# Patient Record
Sex: Female | Born: 1990 | Race: White | Hispanic: No | Marital: Single | Smoking: Never smoker
Health system: Southern US, Community
[De-identification: ages and names within clinical notes are randomized; demographics above are authoritative.]

---

## 2011-01-13 ENCOUNTER — Encounter: Payer: Self-pay | Admitting: Emergency Medicine

## 2011-01-13 ENCOUNTER — Emergency Department
Admission: EM | Admit: 2011-01-13 | Discharge: 2011-01-13 | Disposition: A | Discharge status: Home / Self Care | Payer: BC Managed Care – PPO | Source: Home / Self Care | Attending: Family Medicine | Admitting: Family Medicine

## 2011-01-13 DIAGNOSIS — R5381 Other malaise: Secondary | ICD-10-CM

## 2011-01-13 DIAGNOSIS — J039 Acute tonsillitis, unspecified: Secondary | ICD-10-CM

## 2011-01-13 DIAGNOSIS — R5383 Other fatigue: Secondary | ICD-10-CM

## 2011-01-13 DIAGNOSIS — B2790 Infectious mononucleosis, unspecified without complication: Secondary | ICD-10-CM

## 2011-01-13 LAB — POCT RAPID STREP A (OFFICE): Rapid Strep A Screen: NEGATIVE

## 2011-01-13 MED ORDER — LIDOCAINE VISCOUS 2 % MT SOLN: 15.0000 mL | Freq: Three times a day (TID) | OROMUCOSAL | Status: AC

## 2011-01-13 MED ORDER — PREDNISONE 10 MG PO TABS: ORAL_TABLET | ORAL | Status: AC

## 2011-01-13 MED ORDER — HYDROCODONE-ACETAMINOPHEN 5-500 MG PO TABS: 1.0000 | ORAL_TABLET | Freq: Every evening | ORAL | Status: AC | PRN

## 2011-01-13 NOTE — ED Provider Notes (Signed)
History     CSN: [id number] Arrival date & time: 01/13/2011  5:59 PM   First MD Initiated Contact with Patient 01/13/11 1803      Chief Complaint  Patient presents with  . Sore Throat      HPI Comments: Patient states that she developed sinus congestion about a week ago that improved somewhat, then 5 days ago developed sore throat that has gradually become worse.  She states that this is the worst sore throat she has ever had, and awakens her at night.  She is able to swallow, however  Patient is a 20 y.o. female presenting with pharyngitis. The history is provided by the patient and a parent.  Sore Throat This is a new problem. The current episode started more than 2 days ago (One week ago). The problem occurs constantly. The problem has been gradually worsening. Associated symptoms comments: Sinus congestion. The symptoms are aggravated by swallowing. The symptoms are relieved by nothing. Treatments tried: Ibuprofen. The treatment provided no relief.    History reviewed. No pertinent past medical history.  History reviewed. No pertinent past surgical history.  History reviewed. No pertinent family history.  History  Substance Use Topics  . Smoking status: Not on file  . Smokeless tobacco: Not on file  . Alcohol Use: Not on file    OB History    Grav Para Term Preterm Abortions TAB SAB Ect Mult Living                  Review of Systems  Constitutional: Positive for chills, appetite change and fatigue.  HENT: Positive for congestion, sore throat, trouble swallowing, neck pain and neck stiffness.   Eyes: Negative.   Respiratory: Negative.   Cardiovascular: Negative.   Gastrointestinal: Negative.   Genitourinary: Negative.   Skin: Negative.   Neurological: Negative.   Hematological: Positive for adenopathy.    Allergies  Review of patient's allergies indicates no known allergies.  Home Medications   Current Outpatient Rx  Name Route Sig Dispense Refill  .  IBUPROFEN 600 MG PO TABS Oral Take 600 mg by mouth every 6 (six) hours as needed.      Marland Kitchen HYDROCODONE-ACETAMINOPHEN 5-500 MG PO TABS Oral Take 1-2 tablets by mouth at bedtime as needed for pain. 10 tablet 0  . LIDOCAINE VISCOUS 2 % MT SOLN Oral Take 15 mLs by mouth 4 (four) times daily -  before meals and at bedtime. Gargle, then expectorate 200 mL 0  . PREDNISONE 10 MG PO TABS  Take 2 tabs today, then 2 tabs twice daily for three days, then one tab twice daily for 2 days, then 1 daily for two days.  Take PC 20 tablet 0    BP 126/85  Pulse 94  Temp(Src) 97.7 F (36.5 C) (Oral)  Resp 16  Ht 5\' 5"  (1.651 m)  Wt 254 lb (115.214 kg)  BMI 42.27 kg/m2  SpO2 99%  LMP 01/02/2011  Physical Exam  Constitutional: She is oriented to person, place, and time. She appears well-developed and well-nourished. No distress.       Obese  HENT:  Head: Normocephalic.  Right Ear: External ear normal.  Left Ear: External ear normal.  Nose: Nose normal.  Mouth/Throat: Uvula is midline and mucous membranes are normal. Oropharyngeal exudate, posterior oropharyngeal edema and posterior oropharyngeal erythema present.       Small amount of exudate present on tonsils, left worse than right  Eyes: Conjunctivae and EOM are normal. Pupils are equal,  round, and reactive to light. Right eye exhibits no discharge. Left eye exhibits no discharge.  Neck: Neck supple. No thyromegaly present.  Cardiovascular: Normal heart sounds.   Pulmonary/Chest: Effort normal and breath sounds normal. No stridor.  Abdominal: Soft. Bowel sounds are normal.       Mild tenderness over spleen without masses or hepatosplenomegaly.  Bowel sounds are present.  No CVA or flank tenderness.   Musculoskeletal: She exhibits no edema.  Lymphadenopathy:    She has cervical adenopathy.       Right cervical: Superficial cervical and posterior cervical adenopathy present.       Left cervical: Superficial cervical and posterior cervical adenopathy  present.  Neurological: She is alert and oriented to person, place, and time.  Skin: Skin is warm and dry.  Psychiatric: She has a normal mood and affect.    ED Course  Procedures:  None   Labs Reviewed  STREP A DNA PROBE:  Pending  MONONUCLEOSIS SCREEN:  Positive  POCT RAPID STREP A (OFFICE):  Negative      1. Acute tonsillitis   2. Fatigue   3. Mononucleosis       MDM  Throat culture pending Begin prednisone taper.  Analgesic for bedtime. Lidocaine viscous gargles. Rest, increase fluids.  Mono precautions given. Followup with PCP if not improving one week.        Donna Christen, MD 01/13/11 [phone]

## 2011-01-17 NOTE — ED Notes (Signed)
Rapid Mono spot- positive. Lovey Newcomer Result not in "lab results".

## 2011-03-02 ENCOUNTER — Encounter (HOSPITAL_COMMUNITY): Payer: Self-pay | Admitting: Emergency Medicine

## 2011-03-02 ENCOUNTER — Emergency Department (HOSPITAL_COMMUNITY)
Admission: EM | Admit: 2011-03-02 | Discharge: 2011-03-02 | Disposition: A | Discharge status: Home / Self Care | Payer: BC Managed Care – PPO | Attending: Emergency Medicine | Admitting: Emergency Medicine

## 2011-03-02 ENCOUNTER — Emergency Department (HOSPITAL_COMMUNITY): Payer: BC Managed Care – PPO

## 2011-03-02 DIAGNOSIS — R109 Unspecified abdominal pain: Secondary | ICD-10-CM

## 2011-03-02 DIAGNOSIS — R1084 Generalized abdominal pain: Secondary | ICD-10-CM | POA: Insufficient documentation

## 2011-03-02 DIAGNOSIS — R112 Nausea with vomiting, unspecified: Secondary | ICD-10-CM

## 2011-03-02 LAB — COMPREHENSIVE METABOLIC PANEL
Alkaline Phosphatase: 102 U/L (ref 39–117)
BUN: 12 mg/dL (ref 6–23)
Chloride: 99 mEq/L (ref 96–112)
Creatinine, Ser: 0.6 mg/dL (ref 0.50–1.10)
GFR calc Af Amer: >90 mL/min (ref >90)
Glucose, Bld: 121 mg/dL — ABNORMAL HIGH (ref 70–99)
Potassium: 3.5 mEq/L (ref 3.5–5.1)
Total Bilirubin: 0.3 mg/dL (ref 0.3–1.2)

## 2011-03-02 LAB — DIFFERENTIAL
Eosinophils Absolute: 0.1 10*3/uL (ref 0.0–0.7)
Lymphs Abs: 1.6 10*3/uL (ref 0.7–4.0)
Monocytes Absolute: 1.2 10*3/uL — ABNORMAL HIGH (ref 0.1–1.0)
Monocytes Relative: 11 % (ref 3–12)
Neutro Abs: 8.1 10*3/uL — ABNORMAL HIGH (ref 1.7–7.7)
Neutrophils Relative %: 73 % (ref 43–77)

## 2011-03-02 LAB — URINALYSIS, ROUTINE W REFLEX MICROSCOPIC
Bilirubin Urine: NEGATIVE
Glucose, UA: NEGATIVE mg/dL
Ketones, ur: 40 mg/dL — AB
Nitrite: NEGATIVE
Specific Gravity, Urine: 1.017 (ref 1.005–1.030)
pH: 8 (ref 5.0–8.0)

## 2011-03-02 LAB — CBC
HCT: 44.5 % (ref 36.0–46.0)
Hemoglobin: 15.6 g/dL — ABNORMAL HIGH (ref 12.0–15.0)
MCH: 29.1 pg (ref 26.0–34.0)
RBC: 5.36 MIL/uL — ABNORMAL HIGH (ref 3.87–5.11)

## 2011-03-02 LAB — LIPASE, BLOOD: Lipase: 17 U/L (ref 11–59)

## 2011-03-02 MED ORDER — SODIUM CHLORIDE 0.9 % IV BOLUS (SEPSIS)
1000.0000 mL | Freq: Once | INTRAVENOUS | Status: AC
  Administered 2011-03-02: 1000 mL via INTRAVENOUS

## 2011-03-02 MED ORDER — HYDROMORPHONE HCL PF 1 MG/ML IJ SOLN
1.0000 mg | Freq: Once | INTRAMUSCULAR | Status: AC
  Administered 2011-03-02: 1 mg via INTRAVENOUS
  Filled 2011-03-02: qty 1

## 2011-03-02 MED ORDER — ONDANSETRON HCL 4 MG/2ML IJ SOLN
4.0000 mg | Freq: Once | INTRAMUSCULAR | Status: AC
  Administered 2011-03-02: 4 mg via INTRAVENOUS
  Filled 2011-03-02: qty 2

## 2011-03-02 MED ORDER — OXYCODONE-ACETAMINOPHEN 5-325 MG PO TABS: 2.0000 | ORAL_TABLET | ORAL | Status: AC | PRN

## 2011-03-02 MED ORDER — HYDROMORPHONE HCL PF 2 MG/ML IJ SOLN
2.0000 mg | Freq: Once | INTRAMUSCULAR | Status: AC
  Administered 2011-03-02: 2 mg via INTRAMUSCULAR
  Filled 2011-03-02: qty 1

## 2011-03-02 MED ORDER — NITROFURANTOIN MONOHYD MACRO 100 MG PO CAPS: 100.0000 mg | ORAL_CAPSULE | Freq: Two times a day (BID) | ORAL | Status: AC

## 2011-03-02 MED ORDER — IOHEXOL 300 MG/ML  SOLN
100.0000 mL | Freq: Once | INTRAMUSCULAR | Status: AC | PRN
  Administered 2011-03-02: 100 mL via INTRAVENOUS

## 2011-03-02 NOTE — ED Notes (Signed)
Pt is in radiology.

## 2011-03-02 NOTE — ED Provider Notes (Signed)
Medical screening examination/treatment/procedure(s) were performed by non-physician practitioner and as supervising physician I was immediately available for consultation/collaboration.   Srijan Givan, MD 03/02/11 0754 

## 2011-03-02 NOTE — ED Notes (Signed)
Notified ct, patient done drinking ct.

## 2011-03-02 NOTE — ED Notes (Signed)
Pt alert, c/o right abd pain, onset this evening, treated for "mono" in November, resp even unlabored, skin pwd, ambulates to triage

## 2011-03-02 NOTE — ED Provider Notes (Signed)
7:33 AM Patient care resumed from [facility].  Patient came in complaining of right lower Cordran right upper quadrant abdominal pain.  A CT of the abdomen and pelvis was ordered to rule out appendicitis.  There were no abnormal findings including adnexal or suprapubic tenderness on pelvic exam per Theron Arista.  Plan is to treat patient for possible urinary tract infection pain management while in the emergency department and schedule followup with her PCP.  The possibility of doing an abdominal ultrasound was discussed with Theron Arista.    CT :IMPRESSION: Normal appendix. No evidence of bowel obstruction.  No CT findings to account for the patient's abdominal pain.  Original Report Authenticated By: Charline Bills, M.D.  [place]: IMPRESSION:  Mobile gallstones without other sonographic evidence for acute  cholecystitis. Otherwise normal exam.  Patient has been reevaluated and pain has been managed while in the emergency department.  She currently is not having any emesis episodes, abdominal pain, or nausea & is hemodynamically stable.  Patient will be discharged with pain medication and an antibiotic for possible urinary tract infection.  Patient will also be given contact information for general surgery if her symptoms persist.    Jaci Carrel, [place] 03/02/11 [phone]

## 2011-03-02 NOTE — ED Provider Notes (Signed)
History     CSN: [id number]  Arrival date & time 03/02/11  0317   First MD Initiated Contact with Patient 03/02/11 0329      Chief Complaint  Patient presents with  . Abdominal Pain    Right  . Emesis     HPI  History provided by the patient and mother. Patient is 20 year old female with no significant past medical history presents with acute onset of right-sided and lower abdominal pains beginning around midnight. Pain is a sharp stabbing pain that has been persistent. Patient reports lying down in bed at the time when pain began. Patient's mother also reports that patient has history of recent mono infection and diagnosis in November. Patient reports many of the symptoms related to the illness have resolved. Patient was told she had a enlarged spleen at that time. sHe denies any left upper quadrant pains. Symptoms have been associated with one episode of nausea and vomiting. Patient is currently menstruating but denies any abnormalities. She denies any fever, chills, sweats, diarrhea, constipation. Patient has no other significant past medical history.    History reviewed. No pertinent past medical history.  History reviewed. No pertinent past surgical history.  No family history on file.  History  Substance Use Topics  . Smoking status: Not on file  . Smokeless tobacco: Not on file  . Alcohol Use: Not on file    OB History    Grav Para Term Preterm Abortions TAB SAB Ect Mult Living                  Review of Systems  Constitutional: Negative for fever and chills.  Respiratory: Negative for cough and shortness of breath.   Cardiovascular: Negative for chest pain.  Gastrointestinal: Positive for nausea, vomiting and abdominal pain. Negative for diarrhea and constipation.  Genitourinary: Negative for dysuria, frequency, hematuria, flank pain, vaginal discharge and pelvic pain.  All other systems reviewed and are negative.    Allergies  Review of patient's  allergies indicates no known allergies.  Home Medications   Current Outpatient Rx  Name Route Sig Dispense Refill  . IBUPROFEN 600 MG PO TABS Oral Take 600 mg by mouth every 6 (six) hours as needed.        BP 160/87  Pulse 78  Temp(Src) 97 F (36.1 C) (Oral)  Resp 16  Wt 200 lb (90.719 kg)  SpO2 99%  LMP 03/02/2011  Physical Exam  Nursing note and vitals reviewed. Constitutional: She is oriented to person, place, and time. She appears well-developed and well-nourished. No distress.  HENT:  Head: Normocephalic and atraumatic.  Cardiovascular: Normal rate and regular rhythm.   Pulmonary/Chest: Effort normal and breath sounds normal. She has no wheezes. She has no rales.  Abdominal: Soft. There is tenderness in the right upper quadrant and right lower quadrant. There is no rigidity, no rebound, no guarding, no CVA tenderness, no tenderness at McBurney's point and negative Murphy's sign.       Obese  Genitourinary: Cervix exhibits no motion tenderness. Right adnexum displays no mass, no tenderness and no fullness. Left adnexum displays no mass, no tenderness and no fullness. No vaginal discharge found.       Chaperone was present. Menstrual bleeding present no cervical motion tenderness, no adnexal tenderness or mass.  Neurological: She is alert and oriented to person, place, and time.  Skin: Skin is warm and dry. No rash noted.  Psychiatric: She has a normal mood and affect. Her behavior is normal.  ED Course  Procedures (including critical care time)   Labs Reviewed  URINALYSIS, ROUTINE W REFLEX MICROSCOPIC  POCT PREGNANCY, URINE  CBC  DIFFERENTIAL  COMPREHENSIVE METABOLIC PANEL  LIPASE, BLOOD  GC/CHLAMYDIA PROBE AMP, GENITAL  WET PREP, GENITAL   Results for orders placed during the hospital encounter of 03/02/11  CBC      Component Value Range   WBC 11.1 (*) 4.0 - 10.5 (K/uL)   RBC 5.36 (*) 3.87 - 5.11 (MIL/uL)   Hemoglobin 15.6 (*) 12.0 - 15.0 (g/dL)   HCT 16.1   09.6 - 04.5 (%)   MCV 83.0  78.0 - 100.0 (fL)   MCH 29.1  26.0 - 34.0 (pg)   MCHC 35.1  30.0 - 36.0 (g/dL)   RDW 40.9  81.1 - 91.4 (%)   Platelets 313  150 - 400 (K/uL)  DIFFERENTIAL      Component Value Range   Neutrophils Relative 73  43 - 77 (%)   Neutro Abs 8.1 (*) 1.7 - 7.7 (K/uL)   Lymphocytes Relative 15  12 - 46 (%)   Lymphs Abs 1.6  0.7 - 4.0 (K/uL)   Monocytes Relative 11  3 - 12 (%)   Monocytes Absolute 1.2 (*) 0.1 - 1.0 (K/uL)   Eosinophils Relative 1  0 - 5 (%)   Eosinophils Absolute 0.1  0.0 - 0.7 (K/uL)   Basophils Relative 0  0 - 1 (%)   Basophils Absolute 0.0  0.0 - 0.1 (K/uL)     No results found.   No diagnosis found.    MDM  3:25 AM patient seen and evaluated. Patient in no acute distress but uncomfortable appearing.  4:00 AM patient's IV has infiltrated patient having momentary relief of pain with medicines is unclear she received all through the IV.  Patient has new IV started.  Medications given patient more comfortable at this time.    Pt discussed in signout to [facility].  She will follow CT results.  Angus Seller, [place] 03/02/11 [phone]

## 2011-03-08 NOTE — ED Provider Notes (Signed)
Medical screening examination/treatment/procedure(s) were performed by non-physician practitioner and as supervising physician I was immediately available for consultation/collaboration.   Bryce Cheever, MD 03/08/11 1458 

## 2011-03-11 ENCOUNTER — Telehealth: Payer: Self-pay | Admitting: Family Medicine

## 2011-03-11 ENCOUNTER — Emergency Department
Admission: EM | Admit: 2011-03-11 | Discharge: 2011-03-11 | Disposition: A | Discharge status: Home / Self Care | Payer: BC Managed Care – PPO | Source: Home / Self Care

## 2011-03-11 DIAGNOSIS — R1013 Epigastric pain: Secondary | ICD-10-CM

## 2011-03-11 DIAGNOSIS — Z9189 Other specified personal risk factors, not elsewhere classified: Secondary | ICD-10-CM

## 2011-03-11 DIAGNOSIS — Z202 Contact with and (suspected) exposure to infections with a predominantly sexual mode of transmission: Secondary | ICD-10-CM

## 2011-03-11 LAB — POCT URINALYSIS DIPSTICK
Glucose, UA: NEGATIVE
Ketones, UA: NEGATIVE
Leukocytes, UA: NEGATIVE
Protein, UA: NEGATIVE

## 2011-03-11 MED ORDER — DOXYCYCLINE HYCLATE 100 MG PO TABS: 100.0000 mg | ORAL_TABLET | Freq: Two times a day (BID) | ORAL | Status: AC

## 2011-03-11 NOTE — ED Notes (Signed)
Dysuria, abdominal pain, had unprotected sex with someone who is positive for chlamydia, (just found out)Was treated for UTI and abdominal pain on 12/27 at [facility] did not know about chlamydia at that time.

## 2011-03-11 NOTE — ED Provider Notes (Signed)
History     CSN: [id number]  Arrival date & time 03/11/11  1408   None     Chief Complaint  Patient presents with  . Urinary Tract Infection     HPI Comments: Patient complains of onset of nausea/vomiting and epigastric and right upper quadrant pain nine days ago and was evaluated at [facility].  There an abdominal US revealed mobile gall stones but no evidence obstruction and an abdominal CT scan was negative.  A pelvic exam was unremarkable.  She was empirically treated for a UTI with Macrobid. Since then she developed watery diarrhea, but most of her GI symptoms have resolved although she still has mild subxiphoid epigastric discomfort, and nausea has decreased. She recalls having had a flu like illness about 1.5 weeks ago, now resolved. She admits that she recently had unprotected sex with someone who may have had chlamydia, but she presently has no pelvic pain or vaginal discharge.  LMP ended 4 days ago and was normal.  She presently has no urinary symptoms.  She has had no recent fevers, chills, and sweats.   The history is provided by the patient.    No past medical history on file.  No past surgical history on file.  No family history on file.  History  Substance Use Topics  . Smoking status: Not on file  . Smokeless tobacco: Not on file  . Alcohol Use: Not on file    OB History    Grav Para Term Preterm Abortions TAB SAB Ect Mult Living                  Review of Systems  Constitutional: Positive for appetite change and fatigue. Negative for fever and chills.  HENT: Negative.   Eyes: Negative.   Respiratory: Negative.   Cardiovascular: Negative.   Gastrointestinal: Positive for nausea, vomiting, abdominal pain and diarrhea. Negative for constipation, blood in stool and rectal pain.       GI symptoms have generally improved.  Genitourinary: Negative.   Musculoskeletal: Negative.   Neurological: Negative for headaches.    Allergies  Review of patient's  allergies indicates not on file.  Home Medications   Current Outpatient Rx  Name Route Sig Dispense Refill  . IBUPROFEN 200 MG PO TABS Oral Take 400 mg by mouth every 6 (six) hours as needed. Pain     . NITROFURANTOIN MONOHYD MACRO 100 MG PO CAPS Oral Take 1 capsule (100 mg total) by mouth 2 (two) times daily. 10 capsule 0  . OXYCODONE-ACETAMINOPHEN 5-325 MG PO TABS Oral Take 2 tablets by mouth every 4 (four) hours as needed for pain. 15 tablet 0    BP 118/88  Pulse 96  Temp(Src) 97.8 F (36.6 C) (Oral)  Resp 18  Ht 5\' 5"  (1.651 m)  Wt 259 lb (117.482 kg)  BMI 43.10 kg/m2  SpO2 99%  LMP 03/02/2011  Physical Exam Nursing notes and Vital Signs reviewed. Appearance:  Patient appears healthy, stated age, and in no acute distress.  Patient is morbidly obese (BMI 43.2) Eyes:  Pupils are equal, round, and reactive to light and accomodation.  Extraocular movement is intact.  Conjunctivae are not inflamed  Ears:  Canals normal.  Tympanic membranes normal.  Nose:  Mildly congested turbinates.  No sinus tenderness.   Pharynx:  Normal Neck:  Supple.  No adenopathy Lungs:  Clear to auscultation.  Breath sounds are equal.  Heart:  Regular rate and rhythm without murmurs, rubs, or gallops.  Abdomen:  There is mild tenderness in the sub-xiphoid epigastric area without masses or hepatosplenomegaly.  Bowel sounds are present.  No CVA or flank tenderness.  Extremities:  No edema.  No calf tenderness Skin:  No rash present.   ED Course  Procedures  none   Labs Reviewed  POCT URINE PREGNANCY negative  POCT URINALYSIS DIPSTICK small bili; SG> 1.030 Urine pregnancy test negative      1. Abdominal pain, epigastric   2. Possible exposure to STD       MDM    ? Acid reflux.  Recommend a trial of Prilosec OTC for two weeks.  Appears to have a resolving viral gastroenteritis by history. Urine culture pending. No lower abdominal tenderness, and no vaginal discharge by history.  With  possible recent exposure to chlamydia, will begin doxycycline.  Gradually advance diet as tolerated.  Avoid milk products until well.  Increase fluid intake.        Donna Christen, MD 03/11/11 [phone]

## 2011-03-12 LAB — URINE CULTURE: Colony Count: 2000

## 2011-03-14 ENCOUNTER — Telehealth: Payer: Self-pay | Admitting: Emergency Medicine

## 2011-06-24 ENCOUNTER — Encounter: Payer: Self-pay | Admitting: *Deleted

## 2011-06-24 ENCOUNTER — Emergency Department
Admission: EM | Admit: 2011-06-24 | Discharge: 2011-06-24 | Disposition: A | Discharge status: Home / Self Care | Payer: BC Managed Care – PPO | Source: Home / Self Care

## 2011-06-24 DIAGNOSIS — B373 Candidiasis of vulva and vagina: Secondary | ICD-10-CM

## 2011-06-24 DIAGNOSIS — B3731 Acute candidiasis of vulva and vagina: Secondary | ICD-10-CM

## 2011-06-24 LAB — POCT URINALYSIS DIP (MANUAL ENTRY)
Bilirubin, UA: NEGATIVE
Blood, UA: NEGATIVE
Glucose, UA: NEGATIVE
Ketones, POC UA: NEGATIVE
Spec Grav, UA: <=1.005

## 2011-06-24 MED ORDER — TERCONAZOLE 0.4 % VA CREA: TOPICAL_CREAM | VAGINAL | Status: AC

## 2011-06-24 NOTE — ED Provider Notes (Signed)
History     CSN: [id number]  Arrival date & time 06/24/11  1452   None     Chief Complaint  Patient presents with  . Vaginal Itching  . Dysuria     HPI Comments:  Patient reports that she was treated by her ENT three weeks ago for a tonsillitis.  She has had recurrent vaginal itching and dysuria without discharge.  She states that Monistat cream helped, but she had no response from Difulcan tabs (twice).  No pelvic or abdominal pain.  No rash.  No fevers, chills, and sweats.  Patient's last menstrual period was 05/29/2011.   She also wonders if she has gluten sensitivity.  She has noted occasional blood in stool that disappears when she discontinues wheat.  No nausea/vomiting.  No family history of GI disorders.  Patient is a 21 y.o. female presenting with vaginal itching. The history is provided by the patient.  Vaginal Itching This is a new problem. Episode onset: one month ago. The problem occurs constantly. The problem has not changed since onset.Pertinent negatives include no chest pain and no abdominal pain. The symptoms are aggravated by nothing. The symptoms are relieved by nothing. Treatments tried: OTC cream. The treatment provided moderate relief.    History reviewed. No pertinent past medical history.  History reviewed. No pertinent past surgical history.  History reviewed. No pertinent family history.  History  Substance Use Topics  . Smoking status: Never Smoker   . Smokeless tobacco: Not on file  . Alcohol Use: No    OB History    Grav Para Term Preterm Abortions TAB SAB Ect Mult Living                  Review of Systems  Cardiovascular: Negative for chest pain.  Gastrointestinal: Negative for abdominal pain.  All other systems reviewed and are negative.    Allergies  Review of patient's allergies indicates no known allergies.  Home Medications   Current Outpatient Rx  Name Route Sig Dispense Refill  . IBUPROFEN 200 MG PO TABS Oral Take 400 mg by  mouth every 6 (six) hours as needed. Pain     . TERCONAZOLE 0.4 % VA CREA  Place one applicatorful in vagina at bedtime for one week 45 g 0    BP 117/82  Pulse 83  Temp(Src) 97.6 F (36.4 C) (Oral)  Resp 16  Ht 5\' 5"  (1.651 m)  Wt 248 lb 8 oz (112.719 kg)  BMI 41.35 kg/m2  SpO2 99%  LMP 05/29/2011  Physical Exam Nursing notes and Vital Signs reviewed. Appearance:  Patient appears stated age, and in no acute distress.  Patient is obese (BMI 41.4) Eyes:  Pupils are equal, round, and reactive to light and accomodation.  Extraocular movement is intact.  Conjunctivae are not inflamed   Pharynx:  Normal Neck:  Supple.   No adenopathy Lungs:  Clear to auscultation.  Breath sounds are equal.  Heart:  Regular rate and rhythm without murmurs, rubs, or gallops.  Abdomen:  Nontender without masses or hepatosplenomegaly.  Bowel sounds are present.  No CVA or flank tenderness.  Extremities:  No edema.  No calf tenderness Skin:  No rash present.   Genitourinary:  Vulva appears normal without lesions or erythema.  Vagina has normal mucosae without lesions.  There is a small amount of white discharge in the vaginal vault.  Cervix appears normal without lesions.  There is no discharge present in the cervical os.  No cervical motion  tenderness is present.     Uterus is small and non-tender.  Adnexae are non-tender without masses.  ED Course  Procedures  none   Labs Reviewed  POCT URINALYSIS DIP (MANUAL ENTRY) negative  POCT KOH/wet prep:  No clue cells, no WBC, no trich, positive yeast cells.  1. Candida vaginitis       MDM  GC/chlamydia pending  Begin Terazol Vaginal cream QHS for one week. Followup with PCP or gastroenterologist for possible Celiac disease        Lattie Haw, MD 06/24/11 1743

## 2011-06-24 NOTE — Discharge Instructions (Signed)
Candidal Vulvovaginitis Candidal vulvovaginitis is an infection of the vagina and vulva. The vulva is the skin around the opening of the vagina. This may cause itching and discomfort in and around the vagina.  HOME CARE  Only take medicine as told by your doctor.   Do not have sex (intercourse) until the infection is healed or as told by your doctor.   Practice safe sex.   Tell your sex partner about your infection.   Do not douche or use tampons.   Wear cotton underwear. Do not wear tight pants or panty hose.   Eat yogurt. This may help treat and prevent yeast infections.  GET HELP RIGHT AWAY IF:   You have a fever.   Your problems get worse during treatment or do not get better in 3 days.   You have discomfort, irritation, or itching in your vagina or vulva area.   You have pain after sex.   You start to get belly (abdominal) pain.  MAKE SURE YOU:  Understand these instructions.   Will watch your condition.   Will get help right away if you are not doing well or get worse.  Document Released: 05/19/2008 Document Revised: 02/09/2011 Document Reviewed: 05/19/2008 Exit[employer] Patient Information 2012 [employer]. 

## 2011-06-24 NOTE — ED Notes (Signed)
Pt has had on and off symptoms for a month with painful urination and vaginal itching.  She used Monistat 1 month ago but the itching is back.  Pt denies unprotected intercourse.  Also asking about a possible Gluten allergy she says she sometimes has blood in her stool and stomach pain and thinks that is why.

## 2011-06-26 ENCOUNTER — Telehealth: Payer: Self-pay

## 2011-06-26 LAB — GC/CHLAMYDIA PROBE AMP, GENITAL
Chlamydia, DNA Probe: NEGATIVE
GC Probe Amp, Genital: NEGATIVE

## 2011-06-26 NOTE — ED Notes (Signed)
Patient advised of results after conformation of date of birth.

## 2012-01-31 IMAGING — CT CT ABD-PELV W/ CM
1 of 2 series · 15 of 32 positions shown, 19 images · IV contrast (100 ML OMNI 300)
Comparison: None.

CLINICAL DATA: Right abdominal pain, nausea/vomiting, evaluate for
appendicitis

CT ABDOMEN AND PELVIS WITH CONTRAST
TECHNIQUE: Multidetector CT imaging of the abdomen and pelvis was
performed following the standard protocol during bolus
administration of intravenous contrast.
Contrast: 100mL OMNIPAQUE IOHEXOL 300 MG/ML IV SOLN

[Series 2: abd/pel with · axial · 0.68mm/px · z∈[+1249,+1674]mm · 15 of 93 slices shown, 19 images]
[im 4/93  soft-tissue]
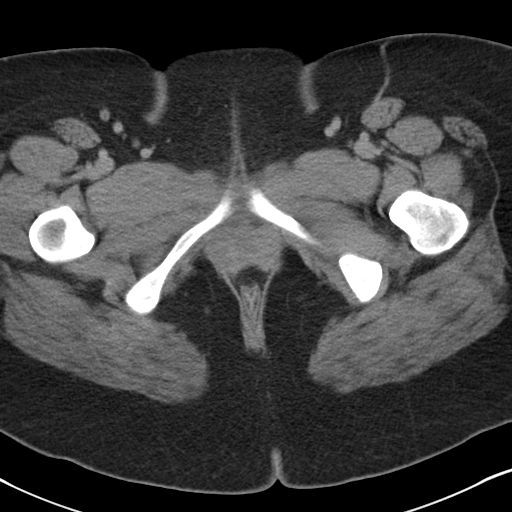
[im 4/93  bone]
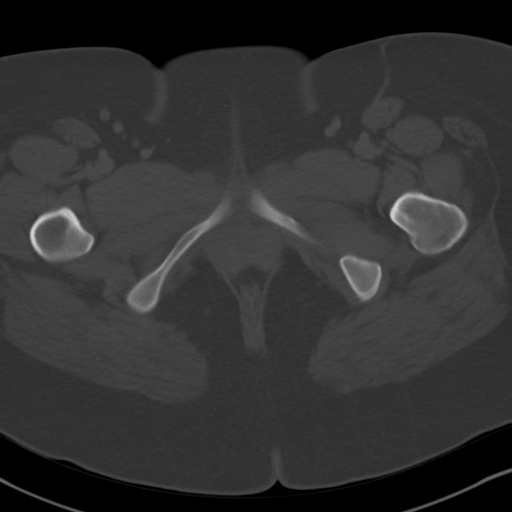
[im 11/93  soft-tissue]
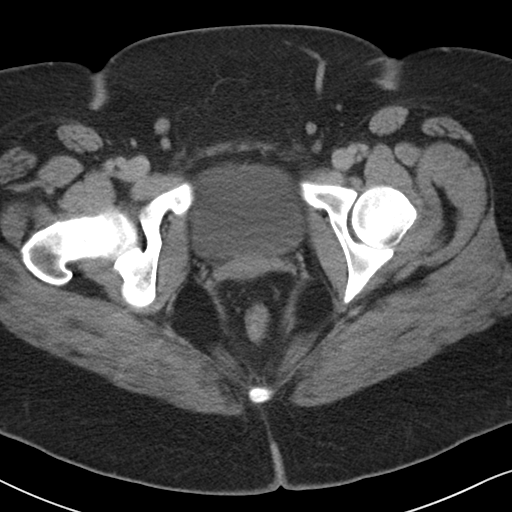
[im 18/93  soft-tissue]
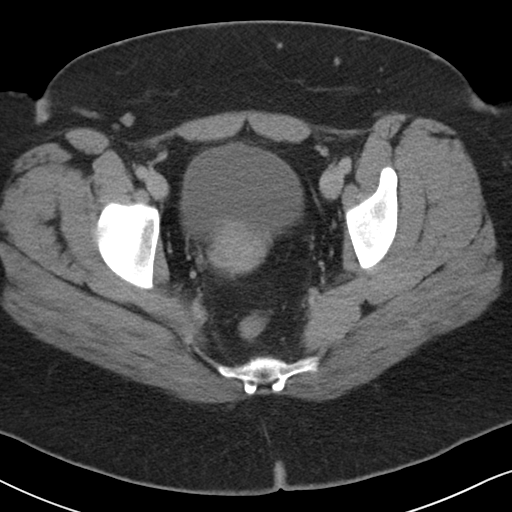
[im 25/93  soft-tissue]
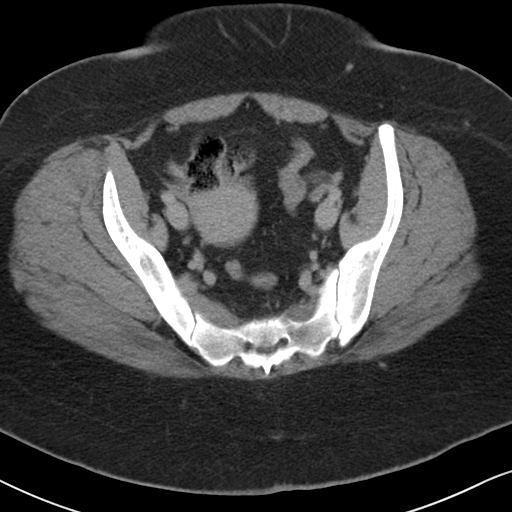
[im 32/93  soft-tissue]
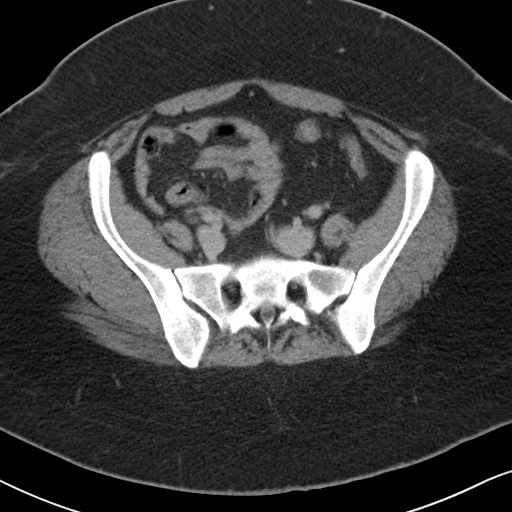
[im 39/93  soft-tissue]
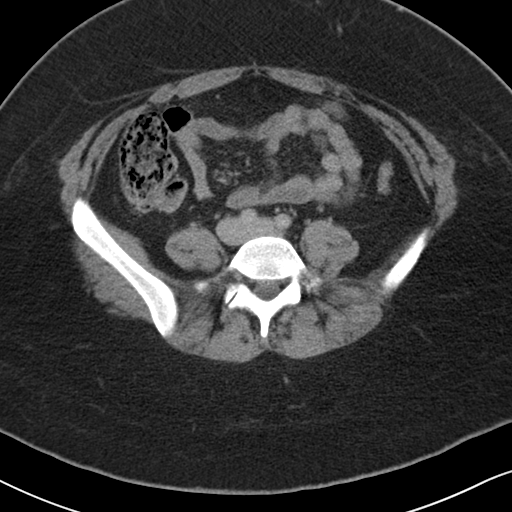
[im 47/93  soft-tissue]
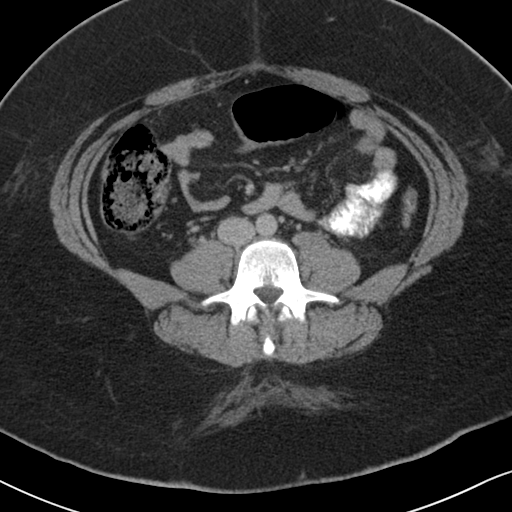
[im 54/93  soft-tissue]
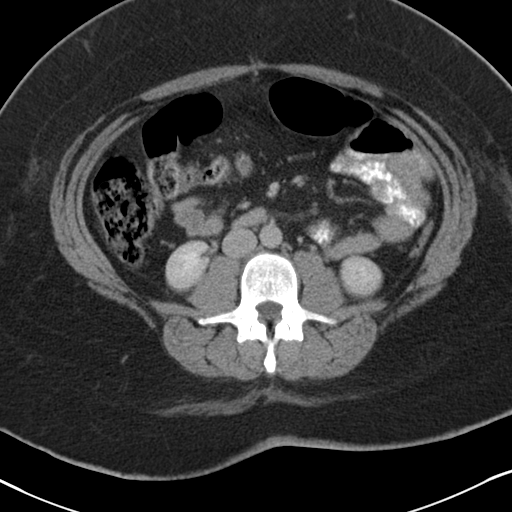
[im 61/93  soft-tissue]
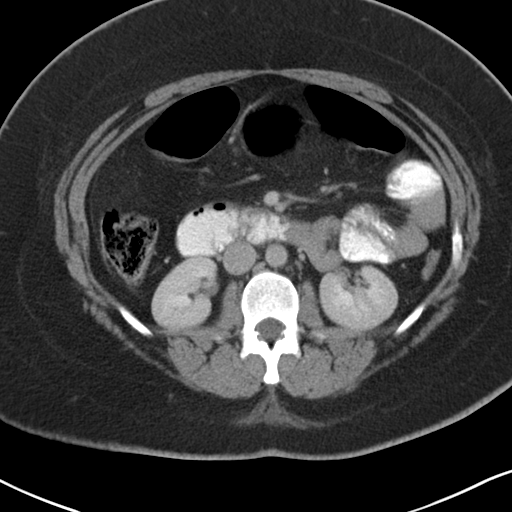
[im 61/93  bone]
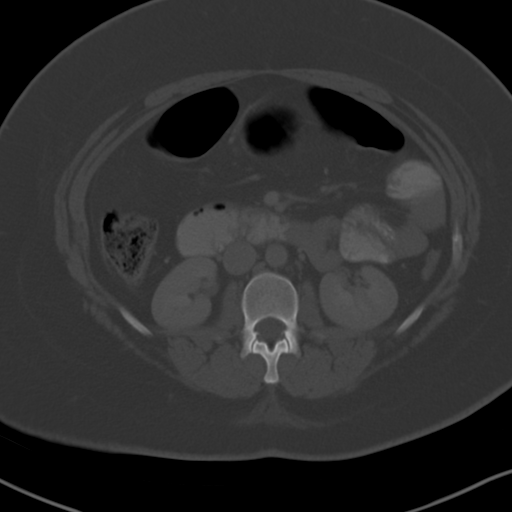
[im 68/93  soft-tissue]
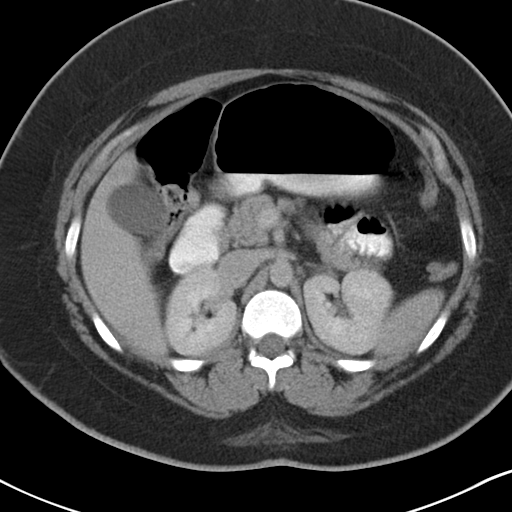
[im 75/93  soft-tissue]
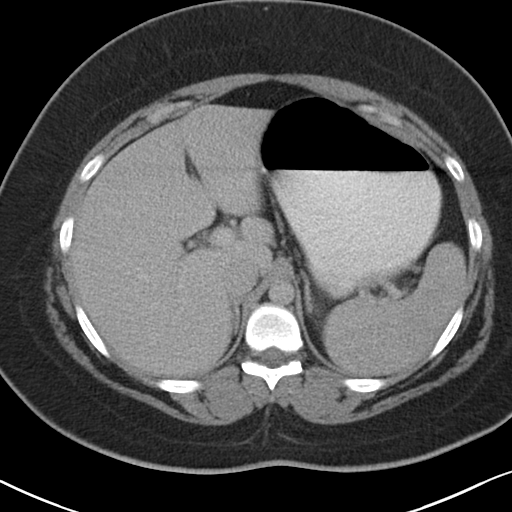
[im 78/93  lung]
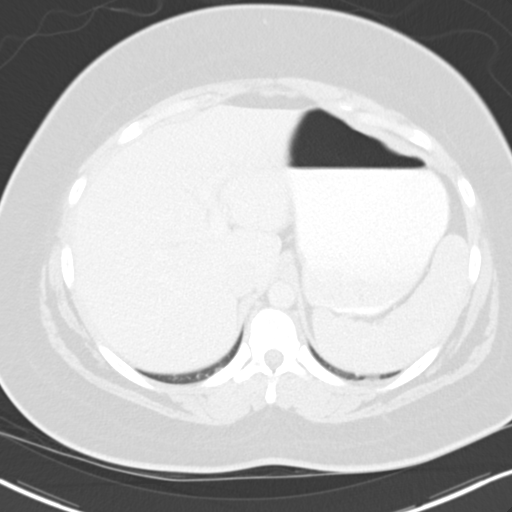
[im 82/93  soft-tissue]
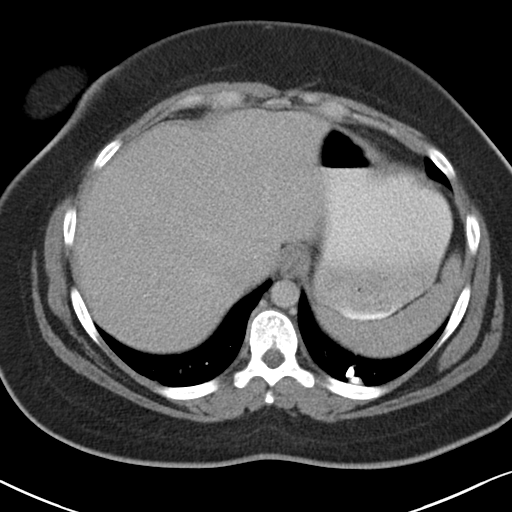
[im 82/93  lung]
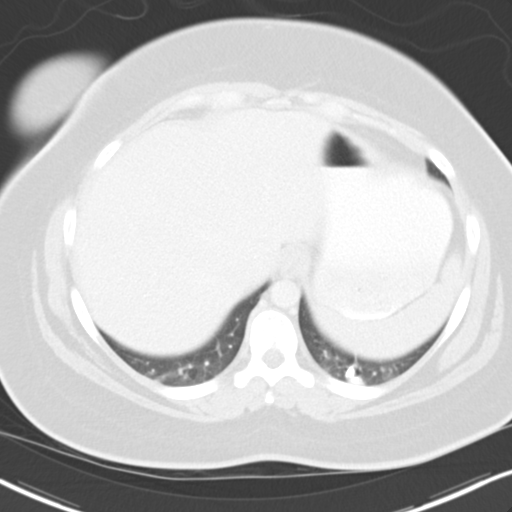
[im 85/93  lung]
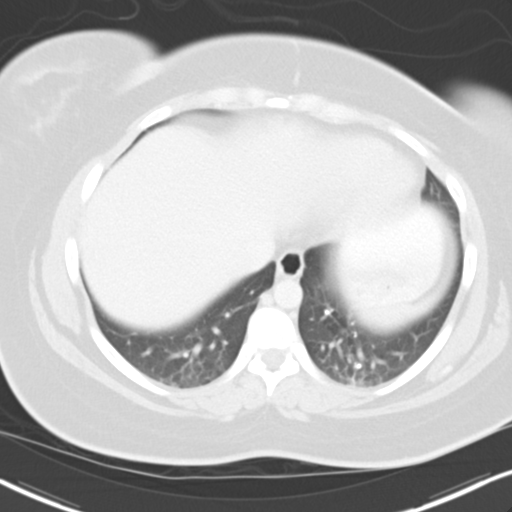
[im 89/93  soft-tissue]
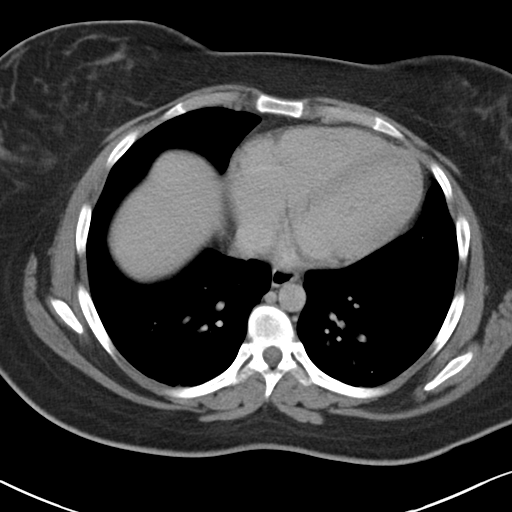
[im 89/93  lung]
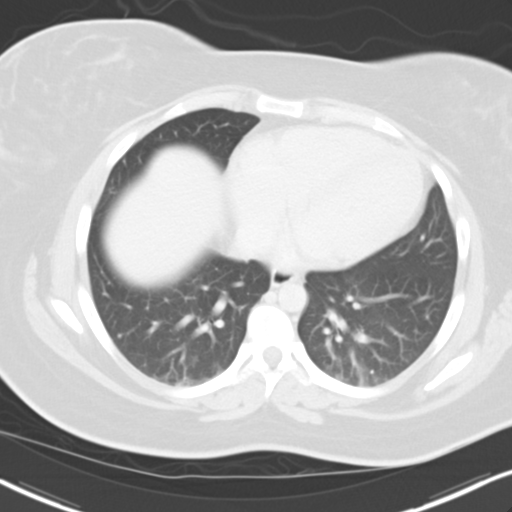

[15 of 32 positions shown; findings below may reference images not displayed]

FINDINGS: Mild patchy opacity atelectasis lung bases.  Calcified
left lower lobe granulomata.

Liver, spleen, pancreas, and adrenal glands are within normal
limits.

Gallbladder is unremarkable.  No intrahepatic or extrahepatic
ductal dilatation.

Kidneys are within normal limits.  No hydronephrosis.

No evidence of bowel obstruction.  Normal appendix.

No evidence of abdominal aortic aneurysm.

No abdominopelvic ascites.

No suspicious abdominopelvic lymphadenopathy.

Uterus and bilateral ovaries are unremarkable.

Bladder is within normal limits.

Visualized osseous structures are within normal limits.
IMPRESSION: Normal appendix.  No evidence of bowel obstruction.

No CT findings to account for the patient's abdominal pain.

## 2014-09-28 ENCOUNTER — Emergency Department (HOSPITAL_COMMUNITY): Payer: BLUE CROSS/BLUE SHIELD

## 2014-09-28 ENCOUNTER — Emergency Department (HOSPITAL_COMMUNITY)
Admission: EM | Admit: 2014-09-28 | Discharge: 2014-09-28 | Disposition: A | Discharge status: Home / Self Care | Payer: BLUE CROSS/BLUE SHIELD | Attending: Physician Assistant | Admitting: Physician Assistant

## 2014-09-28 ENCOUNTER — Encounter (HOSPITAL_COMMUNITY): Payer: Self-pay | Admitting: Emergency Medicine

## 2014-09-28 DIAGNOSIS — S93401A Sprain of unspecified ligament of right ankle, initial encounter: Secondary | ICD-10-CM | POA: Diagnosis not present

## 2014-09-28 DIAGNOSIS — W108XXA Fall (on) (from) other stairs and steps, initial encounter: Secondary | ICD-10-CM | POA: Insufficient documentation

## 2014-09-28 DIAGNOSIS — Y9289 Other specified places as the place of occurrence of the external cause: Secondary | ICD-10-CM | POA: Insufficient documentation

## 2014-09-28 DIAGNOSIS — Y9301 Activity, walking, marching and hiking: Secondary | ICD-10-CM | POA: Insufficient documentation

## 2014-09-28 DIAGNOSIS — S99911A Unspecified injury of right ankle, initial encounter: Secondary | ICD-10-CM | POA: Diagnosis present

## 2014-09-28 DIAGNOSIS — Y998 Other external cause status: Secondary | ICD-10-CM | POA: Diagnosis not present

## 2014-09-28 DIAGNOSIS — S90415A Abrasion, left lesser toe(s), initial encounter: Secondary | ICD-10-CM | POA: Diagnosis not present

## 2014-09-28 MED ORDER — TRAMADOL HCL 50 MG PO TABS
50.0000 mg | ORAL_TABLET | Freq: Once | ORAL | Status: AC
  Administered 2014-09-28: 50 mg via ORAL
  Filled 2014-09-28: qty 1

## 2014-09-28 MED ORDER — IBUPROFEN 800 MG PO TABS: 800.0000 mg | ORAL_TABLET | Freq: Three times a day (TID) | ORAL | Status: AC

## 2014-09-28 NOTE — ED Notes (Signed)
Pt states she was coming down some steps and her right ankle gave out  Pt is c/o pain and swelling to her right ankle  Pt has an abrasion noted to the outside of the ankle   Pt also has abrasions noted to the left foot

## 2014-09-28 NOTE — Discharge Instructions (Signed)
Ankle Sprain °An ankle sprain is an injury to the strong, fibrous tissues (ligaments) that hold the bones of your ankle joint together.  °CAUSES °An ankle sprain is usually caused by a fall or by twisting your ankle. Ankle sprains most commonly occur when you step on the outer edge of your foot, and your ankle turns inward. People who participate in sports are more prone to these types of injuries.  °SYMPTOMS  °· Pain in your ankle. The pain may be present at rest or only when you are trying to stand or walk. °· Swelling. °· Bruising. Bruising may develop immediately or within 1 to 2 days after your injury. °· Difficulty standing or walking, particularly when turning corners or changing directions. °DIAGNOSIS  °Your caregiver will ask you details about your injury and perform a physical exam of your ankle to determine if you have an ankle sprain. During the physical exam, your caregiver will press on and apply pressure to specific areas of your foot and ankle. Your caregiver will try to move your ankle in certain ways. An X-ray exam may be done to be sure a bone was not broken or a ligament did not separate from one of the bones in your ankle (avulsion fracture).  °TREATMENT  °Certain types of braces can help stabilize your ankle. Your caregiver can make a recommendation for this. Your caregiver may recommend the use of medicine for pain. If your sprain is severe, your caregiver may refer you to a surgeon who helps to restore function to parts of your skeletal system (orthopedist) or a physical therapist. °HOME CARE INSTRUCTIONS  °· Apply ice to your injury for 1-2 days or as directed by your caregiver. Applying ice helps to reduce inflammation and pain. °· Put ice in a plastic bag. °· Place a towel between your skin and the bag. °· Leave the ice on for 15-20 minutes at a time, every 2 hours while you are awake. °· Only take over-the-counter or prescription medicines for pain, discomfort, or fever as directed by  your caregiver. °· Elevate your injured ankle above the level of your heart as much as possible for 2-3 days. °· If your caregiver recommends crutches, use them as instructed. Gradually put weight on the affected ankle. Continue to use crutches or a cane until you can walk without feeling pain in your ankle. °· If you have a plaster splint, wear the splint as directed by your caregiver. Do not rest it on anything harder than a pillow for the first 24 hours. Do not put weight on it. Do not get it wet. You may take it off to take a shower or bath. °· You may have been given an elastic bandage to wear around your ankle to provide support. If the elastic bandage is too tight (you have numbness or tingling in your foot or your foot becomes cold and blue), adjust the bandage to make it comfortable. °· If you have an air splint, you may blow more air into it or let air out to make it more comfortable. You may take your splint off at night and before taking a shower or bath. Wiggle your toes in the splint several times per day to decrease swelling. °SEEK MEDICAL CARE IF:  °· You have rapidly increasing bruising or swelling. °· Your toes feel extremely cold or you lose feeling in your foot. °· Your pain is not relieved with medicine. °SEEK IMMEDIATE MEDICAL CARE IF: °· Your toes are numb or blue. °·   You have severe pain that is increasing. °MAKE SURE YOU:  °· Understand these instructions. °· Will watch your condition. °· Will get help right away if you are not doing well or get worse. °Document Released: 02/20/2005 Document Revised: 11/15/2011 Document Reviewed: 03/04/2011 °[employer]® Patient Information ©2015 [employer]. This information is not intended to replace advice given to you by your health care provider. Make sure you discuss any questions you have with your health care provider. °Cryotherapy °Cryotherapy means treatment with cold. Ice or gel packs can be used to reduce both pain and swelling. Ice is the most  helpful within the first 24 to 48 hours after an injury or flare-up from overusing a muscle or joint. Sprains, strains, spasms, burning pain, shooting pain, and aches can all be eased with ice. Ice can also be used when recovering from surgery. Ice is effective, has very few side effects, and is safe for most people to use. °PRECAUTIONS  °Ice is not a safe treatment option for people with: °· Raynaud phenomenon. This is a condition affecting small blood vessels in the extremities. Exposure to cold may cause your problems to return. °· Cold hypersensitivity. There are many forms of cold hypersensitivity, including: °¨ Cold urticaria. Red, itchy hives appear on the skin when the tissues begin to warm after being iced. °¨ Cold erythema. This is a red, itchy rash caused by exposure to cold. °¨ Cold hemoglobinuria. Red blood cells break down when the tissues begin to warm after being iced. The hemoglobin that carry oxygen are passed into the urine because they cannot combine with blood proteins fast enough. °· Numbness or altered sensitivity in the area being iced. °If you have any of the following conditions, do not use ice until you have discussed cryotherapy with your caregiver: °· Heart conditions, such as arrhythmia, angina, or chronic heart disease. °· High blood pressure. °· Healing wounds or open skin in the area being iced. °· Current infections. °· Rheumatoid arthritis. °· Poor circulation. °· Diabetes. °Ice slows the blood flow in the region it is applied. This is beneficial when trying to stop inflamed tissues from spreading irritating chemicals to surrounding tissues. However, if you expose your skin to cold temperatures for too long or without the proper protection, you can damage your skin or nerves. Watch for signs of skin damage due to cold. °HOME CARE INSTRUCTIONS °Follow these tips to use ice and cold packs safely. °· Place a dry or damp towel between the ice and skin. A damp towel will cool the skin  more quickly, so you may need to shorten the time that the ice is used. °· For a more rapid response, add gentle compression to the ice. °· Ice for no more than 10 to 20 minutes at a time. The bonier the area you are icing, the less time it will take to get the benefits of ice. °· Check your skin after 5 minutes to make sure there are no signs of a poor response to cold or skin damage. °· Rest 20 minutes or more between uses. °· Once your skin is numb, you can end your treatment. You can test numbness by very lightly touching your skin. The touch should be so light that you do not see the skin dimple from the pressure of your fingertip. When using ice, most people will feel these normal sensations in this order: cold, burning, aching, and numbness. °· Do not use ice on someone who cannot communicate their responses to pain,   such as small children or people with dementia. °HOW TO MAKE AN ICE PACK °Ice packs are the most common way to use ice therapy. Other methods include ice massage, ice baths, and cryosprays. Muscle creams that cause a cold, tingly feeling do not offer the same benefits that ice offers and should not be used as a substitute unless recommended by your caregiver. °To make an ice pack, do one of the following: °· Place crushed ice or a bag of frozen vegetables in a sealable plastic bag. Squeeze out the excess air. Place this bag inside another plastic bag. Slide the bag into a pillowcase or place a damp towel between your skin and the bag. °· Mix 3 parts water with 1 part rubbing alcohol. Freeze the mixture in a sealable plastic bag. When you remove the mixture from the freezer, it will be slushy. Squeeze out the excess air. Place this bag inside another plastic bag. Slide the bag into a pillowcase or place a damp towel between your skin and the bag. °SEEK MEDICAL CARE IF: °· You develop white spots on your skin. This may give the skin a blotchy (mottled) appearance. °· Your skin turns blue or  pale. °· Your skin becomes waxy or hard. °· Your swelling gets worse. °MAKE SURE YOU:  °· Understand these instructions. °· Will watch your condition. °· Will get help right away if you are not doing well or get worse. °Document Released: 10/17/2010 Document Revised: 07/07/2013 Document Reviewed: 10/17/2010 °[employer]® Patient Information ©2015 [employer]. This information is not intended to replace advice given to you by your health care provider. Make sure you discuss any questions you have with your health care provider. ° °

## 2014-09-28 NOTE — ED Provider Notes (Signed)
CSN: [id number]     Arrival date & time 09/28/14  1914 History  This chart was scribed for non-physician practitioner Elpidio Anis, PA-C working with Abelino Derrick, MD by Lyndel Safe, ED Scribe. This patient was seen in room WTR6/WTR6 and the patient's care was started at 8:52 PM.   Chief Complaint  Patient presents with  . Ankle Injury   The history is provided by the patient. No language interpreter was used.   HPI Comments: Catherine Huffman is a 24 y.o. female, with a PMhx of HTN and CA, who presents to the Emergency Department complaining of sudden onset, constant, 10/10 right ankle pain and edema onset 2 hours ago. Pt is with associated abrasions to lateral aspect of right foot and 1 abrasion to left, 5th toe. Pt states 2 hours ago she was walking down a step and missed the step, falling and twisting her right ankle. She notes a previous fracture to her right ankle many years ago.   History reviewed. No pertinent past medical history. History reviewed. No pertinent past surgical history. Family History  Problem Relation Age of Onset  . Hypertension Other   . Cancer Other    History  Substance Use Topics  . Smoking status: Never Smoker   . Smokeless tobacco: Not on file  . Alcohol Use: No   OB History    No data available     Review of Systems  Musculoskeletal: Positive for joint swelling and arthralgias.  Skin: Positive for wound.   Allergies  Review of patient's allergies indicates no known allergies.  Home Medications   Prior to Admission medications   Medication Sig Start Date End Date Taking? Authorizing Provider  ibuprofen (ADVIL,MOTRIN) 200 MG tablet Take 400 mg by mouth every 6 (six) hours as needed. Pain     Historical Provider, MD   BP 127/88 mmHg  Pulse 84  Temp(Src) 98.3 F (36.8 C) (Oral)  Resp 20  Ht  (1.651 m)  Wt 280 lb (127.007 kg)  BMI 46.59 kg/m2  SpO2 99%  LMP 09/14/2014 (Approximate) Physical Exam  Constitutional: She appears  well-developed and well-nourished. No distress.  HENT:  Head: Normocephalic.  Eyes: Conjunctivae are normal.  Cardiovascular: Intact distal pulses.   Pulmonary/Chest: Effort normal. No respiratory distress.  Musculoskeletal:  Right ankle moderately swollen laterally with tenderness extending to distal forefoot. FROM toes, resistant to passive ROM ankle. Joint stable - no laxity. No calf tenderness. Achilles intact.  Neurological: She is alert.  Skin: Skin is warm. No rash noted. No erythema. No pallor.  Right lateral ankle has two 3cm abrasions. Left, 5th toe, 1cm abrasion on the lateral aspect of the toe.   Psychiatric: She has a normal mood and affect. Her behavior is normal.  Nursing note and vitals reviewed.   ED Course  Procedures  DIAGNOSTIC STUDIES: Oxygen Saturation is 99% on RA, normal by my interpretation.    COORDINATION OF CARE: 9:13 PM Discussed treatment plan with pt. Discussed unremarkable Xray results with pt. Will order tramadol  and crutches. Advised pt to apply ice to affected area. Pt acknowledges and agrees to plan.   Labs Review Labs Reviewed - No data to display  Imaging Review No results found.   EKG Interpretation None      MDM   Final diagnoses:  None    1. Right ankle sprain 2. Abrasions, multiple  No acute bony injury on imaging. No joint laxity to suggest tendon injury. Likely ankle sprain requiring supportive  measures. ASO/crutches provided. Ortho follow up prn.  I personally performed the services described in this documentation, which was scribed in my presence. The recorded information has been reviewed and is accurate.     Elpidio Anis, PA-C 09/28/14 2127  Courteney Randall An, MD 09/28/14 2233

## 2015-08-29 IMAGING — CR DG ANKLE COMPLETE 3+V*R*
3 series · 3 of 3 positions shown · non-contrast
Comparison: None.

CLINICAL DATA: Fall

EXAM:
RIGHT ANKLE - COMPLETE 3+ VIEW

[x ankle ap right]
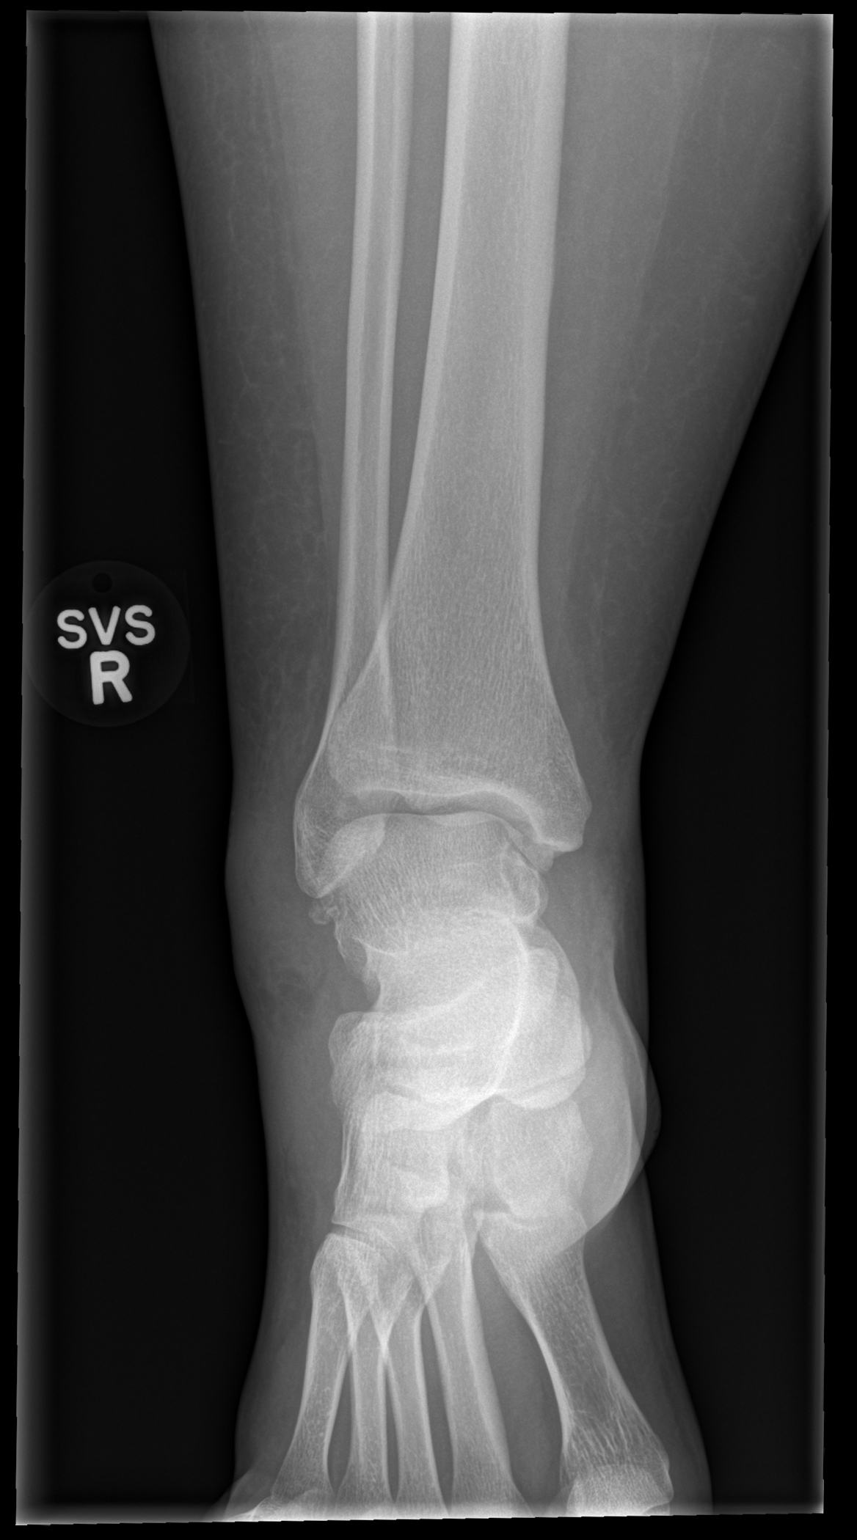

[x ankle obl right]
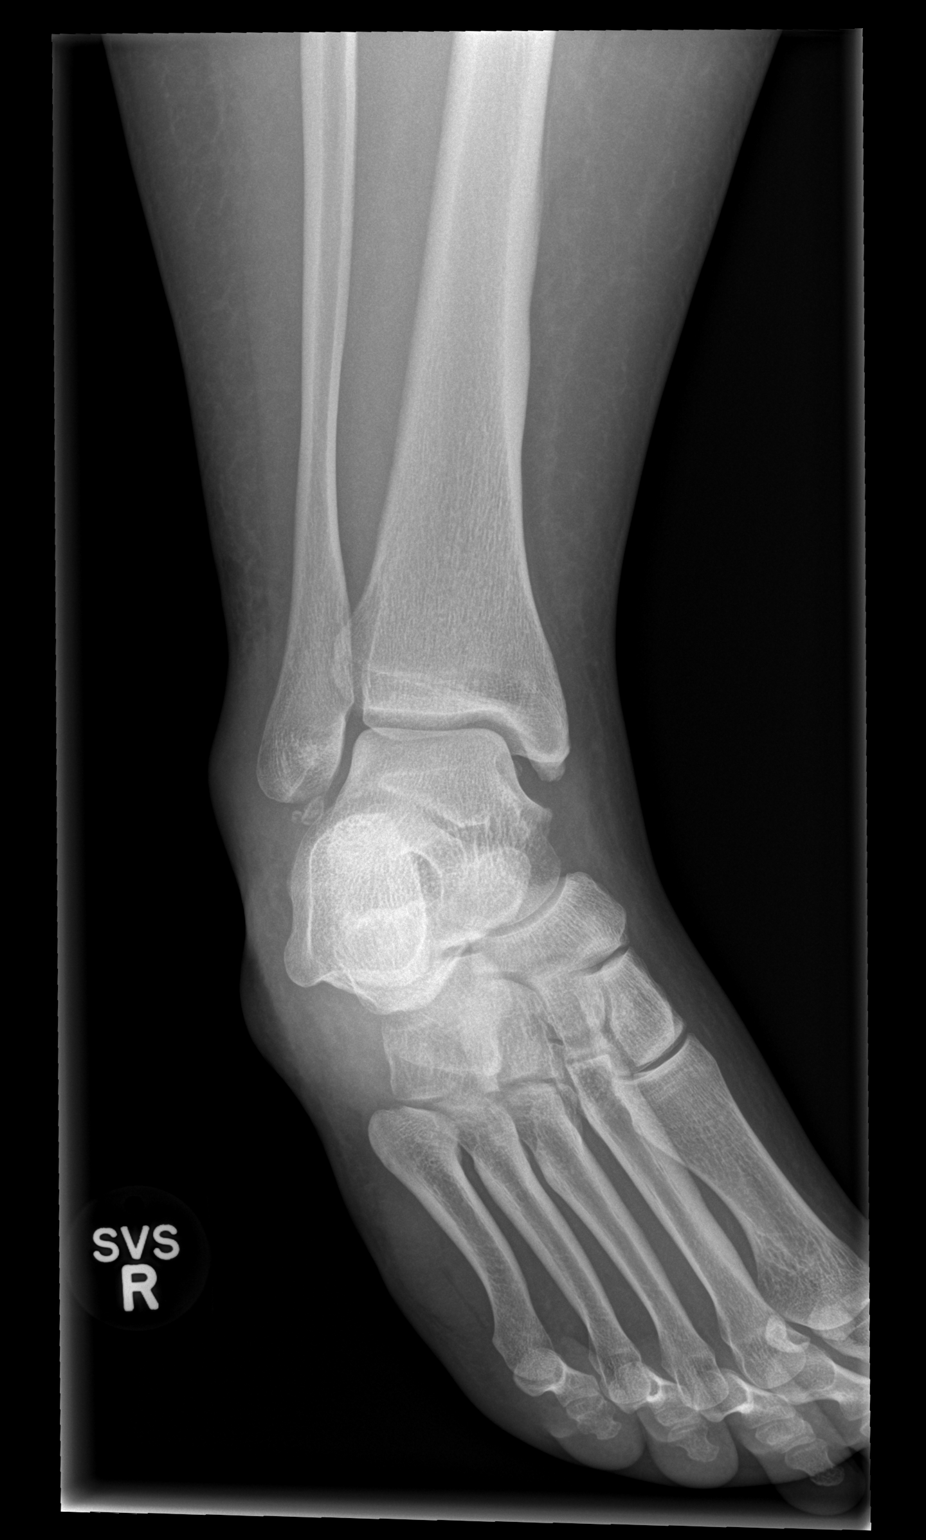

[x ankle lat right]
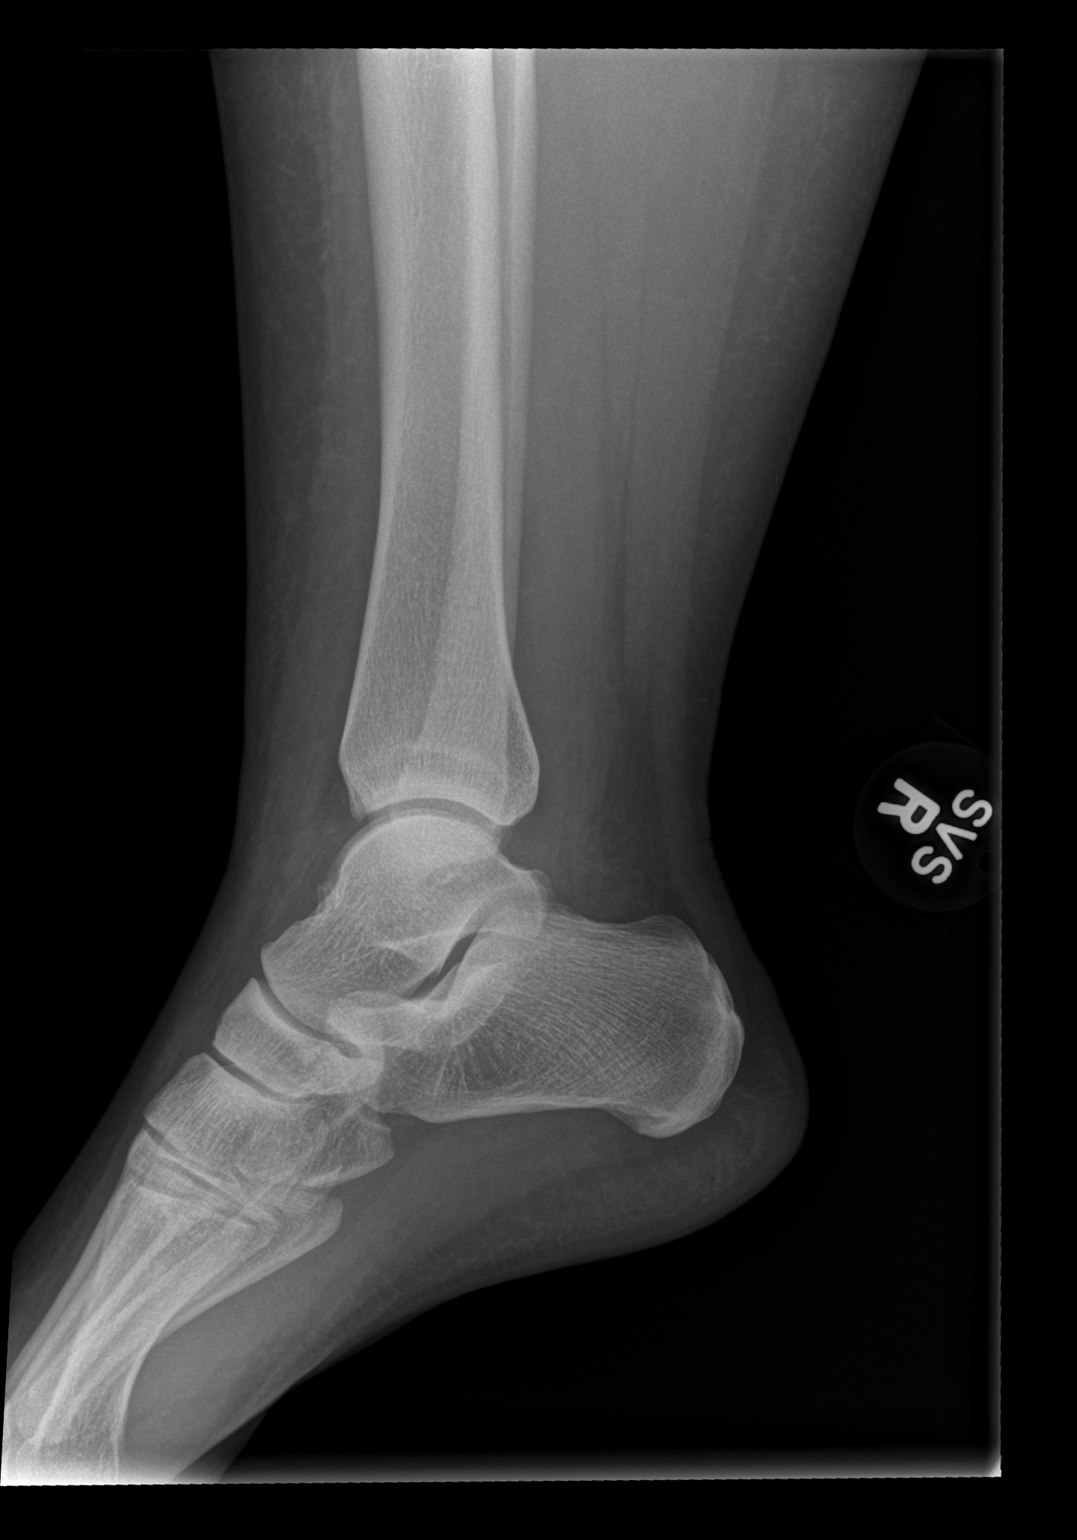

[3 of 3 positions shown; findings below may reference images not displayed]

FINDINGS: No acute fracture or dislocation. Chronic appearing bony densities
inferior to the lateral malleolus are noted. Diffuse soft tissue
swelling about the ankle.
IMPRESSION: No acute bony pathology.
